# Patient Record
Sex: Male | Born: 1965 | Race: Black or African American | Hispanic: No | Marital: Single | Smoking: Never smoker
Health system: Southern US, Community
[De-identification: ages and names within clinical notes are randomized; demographics above are authoritative.]

## PROBLEM LIST (undated history)

## (undated) DIAGNOSIS — R202 Paresthesia of skin: Secondary | ICD-10-CM

## (undated) DIAGNOSIS — R200 Anesthesia of skin: Secondary | ICD-10-CM

## (undated) HISTORY — DX: Paresthesia of skin: R20.0

## (undated) HISTORY — PX: KNEE ARTHROSCOPY: SHX127

## (undated) HISTORY — DX: Paresthesia of skin: R20.2

---

## 2013-02-06 ENCOUNTER — Emergency Department (HOSPITAL_COMMUNITY)
Admission: EM | Admit: 2013-02-06 | Discharge: 2013-02-06 | Disposition: A | Discharge status: Home / Self Care | Payer: BC Managed Care – PPO | Attending: Emergency Medicine | Admitting: Emergency Medicine

## 2013-02-06 ENCOUNTER — Emergency Department (HOSPITAL_COMMUNITY): Payer: BC Managed Care – PPO

## 2013-02-06 ENCOUNTER — Encounter (HOSPITAL_COMMUNITY): Payer: Self-pay | Admitting: Emergency Medicine

## 2013-02-06 DIAGNOSIS — R3915 Urgency of urination: Secondary | ICD-10-CM | POA: Insufficient documentation

## 2013-02-06 DIAGNOSIS — R109 Unspecified abdominal pain: Secondary | ICD-10-CM | POA: Insufficient documentation

## 2013-02-06 DIAGNOSIS — R300 Dysuria: Secondary | ICD-10-CM | POA: Insufficient documentation

## 2013-02-06 DIAGNOSIS — M1990 Unspecified osteoarthritis, unspecified site: Secondary | ICD-10-CM | POA: Insufficient documentation

## 2013-02-06 LAB — URINALYSIS, ROUTINE W REFLEX MICROSCOPIC
Bilirubin Urine: NEGATIVE
Ketones, ur: NEGATIVE mg/dL
Leukocytes, UA: NEGATIVE
Nitrite: NEGATIVE
Protein, ur: NEGATIVE mg/dL
Urobilinogen, UA: 1 mg/dL (ref 0.0–1.0)

## 2013-02-06 NOTE — ED Notes (Signed)
Pt states he started having R flank pain last night.  Pt states sharp pain comes and goes.  Pt states has not been able to urinate since last night, denies lower abd pain.  No hx of abd/urinary issues.

## 2013-02-06 NOTE — ED Provider Notes (Signed)
History     CSN: [id number]  Arrival date & time 02/06/13  0950   First MD Initiated Contact with Patient 02/06/13 1006      Chief Complaint  Patient presents with  . Flank Pain    (Consider location/radiation/quality/duration/timing/severity/associated sxs/prior treatment) HPI Comments: 47 year old male with no significant past medical history presents to the emergency department with his wife complaining of sudden onset right-sided flank pain beginning 8 hours ago. Patient states the pain woke him up from sleep and was sharp in origin, rated 10 out of 10. Pain lasted one hour, he got up and walked around, went to lay back down and pain went away. A few hours later he woke up again with the same sharp pain, lasting about an hour. Patient then went to work, pain returned and decided to come to the emergency department. Currently not in any pain at this time. Each time the pain came on he tried to use the bathroom and was unable to urinate despite having the urge. Yesterday he was urinating without any difficulty. Denies increased urinary frequency, dysuria or hematuria. No history of kidney stones. Denies fever, chills, nausea or vomiting. No history of abdominal surgeries.  Patient is a 47 y.o. male presenting with flank pain. The history is provided by the patient and the spouse.  Flank Pain Pertinent negatives include no abdominal pain, chills, diaphoresis, fever, nausea or vomiting.    History reviewed. No pertinent past medical history.  History reviewed. No pertinent past surgical history.  History reviewed. No pertinent family history.  History  Substance Use Topics  . Smoking status: Never Smoker   . Smokeless tobacco: Not on file  . Alcohol Use: No      Review of Systems  Constitutional: Negative for fever, chills and diaphoresis.  Gastrointestinal: Negative for nausea, vomiting, abdominal pain, diarrhea and constipation.  Genitourinary: Positive for urgency, flank  pain and difficulty urinating. Negative for dysuria and frequency.  Musculoskeletal: Negative for back pain.  All other systems reviewed and are negative.    Allergies  Review of patient's allergies indicates not on file.  Home Medications  No current outpatient prescriptions on file.  BP 133/90  Pulse 74  Temp(Src) 97.5 F (36.4 C) (Oral)  Resp 18  SpO2 97%  Physical Exam  Nursing note and vitals reviewed. Constitutional: He is oriented to person, place, and time. He appears well-developed and well-nourished. No distress.  HENT:  Head: Normocephalic and atraumatic.  Eyes: Conjunctivae are normal.  Neck: Normal range of motion.  Cardiovascular: Normal rate, regular rhythm and normal heart sounds.   Pulmonary/Chest: Effort normal and breath sounds normal. No respiratory distress.  Abdominal: Soft. Normal appearance and bowel sounds are normal. He exhibits no distension. There is tenderness. There is no rigidity, no rebound, no guarding and no CVA tenderness.    Musculoskeletal: Normal range of motion. He exhibits no edema.  Neurological: He is alert and oriented to person, place, and time.  Skin: Skin is warm and dry. He is not diaphoretic.  Psychiatric: He has a normal mood and affect. His behavior is normal.    ED Course  Procedures (including critical care time)  Labs Reviewed  URINALYSIS, ROUTINE W REFLEX MICROSCOPIC - Abnormal; Notable for the following:    Specific Gravity, Urine 1.031 (*)    All other components within normal limits   Ct Abdomen Pelvis Wo Contrast  02/06/2013  *RADIOLOGY REPORT*  Clinical Data: Right flank pain  CT ABDOMEN AND PELVIS WITHOUT CONTRAST  Technique:  Multidetector CT imaging of the abdomen and pelvis was performed following the standard protocol without intravenous contrast.  Comparison: None.  Findings: Minimal left anterior descending coronary artery calcification.  Tiny visceral pleural node in the right major fissure on image  three.  No hydronephrosis.  No urinary calculus.  Unenhanced liver, gallbladder, spleen, pancreas, adrenal glands are within normal limits  Normal appendix.  Prostate and bladder are unremarkable  No free fluid.  No obvious abnormal adenopathy.  Advanced degenerative disc disease at L4-5 and L5-S1 there is significant bilateral lateral recess narrowing and facet arthropathy at L4-5.  IMPRESSION: No urinary obstruction or urinary calculus.  Advanced degenerative change of the lower lumbar spine which could contribute to flank pain.   Original Report Authenticated By: Jolaine Click, M.D.      1. Flank pain   2. Osteoarthritis       MDM  Probable kidney stone- obtaining CT abdomen/pelvis without contrast, U/A. 11:12 AM CT scan showing no stone, however advanced DJD of lower lumbar spine. Still without pain in the ED. Was able to urinate without difficulty. Discharged home with close return precautions. His risk I given for PCP followup. Patient states understanding of plan and is agreeable.    Trevor Mace, PA-C 02/06/13 1113

## 2013-02-07 NOTE — ED Provider Notes (Signed)
Medical screening examination/treatment/procedure(s) were performed by non-physician practitioner and as supervising physician I was immediately available for consultation/collaboration.   Dejean Tribby M Ruvi Fullenwider, DO 02/07/13 1112 

## 2013-11-16 IMAGING — CT CT ABD-PELV W/O CM
1 series · 12 of 32 positions shown, 15 images · non-contrast
Comparison: None.

CLINICAL DATA: Right flank pain

CT ABDOMEN AND PELVIS WITHOUT CONTRAST
TECHNIQUE: Multidetector CT imaging of the abdomen and pelvis was
performed following the standard protocol without intravenous
contrast.

[Series 6: sagittal · sagittal · 0.74mm/px · 12 of 128 slices shown, 15 images]
[im 5/128  lung]
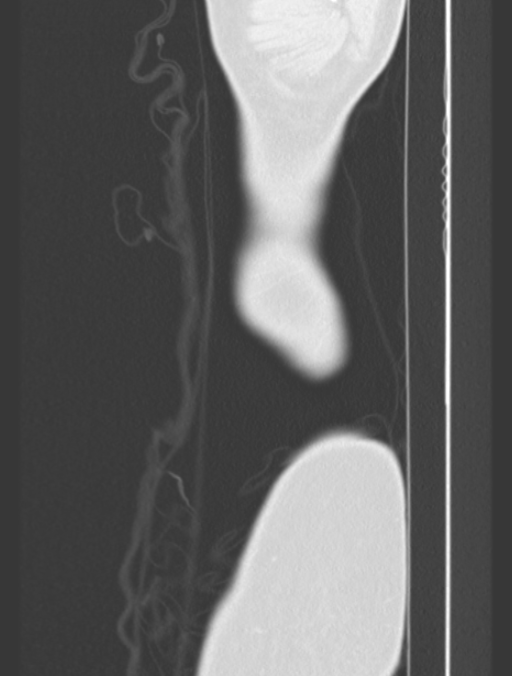
[im 9/128  soft-tissue]
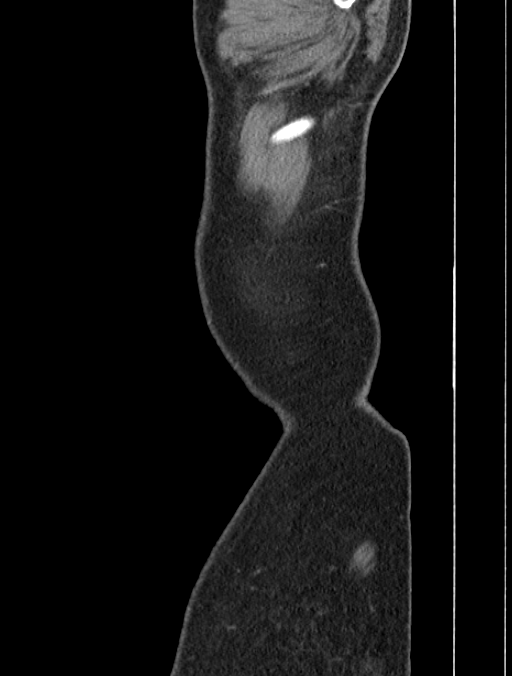
[im 9/128  lung]
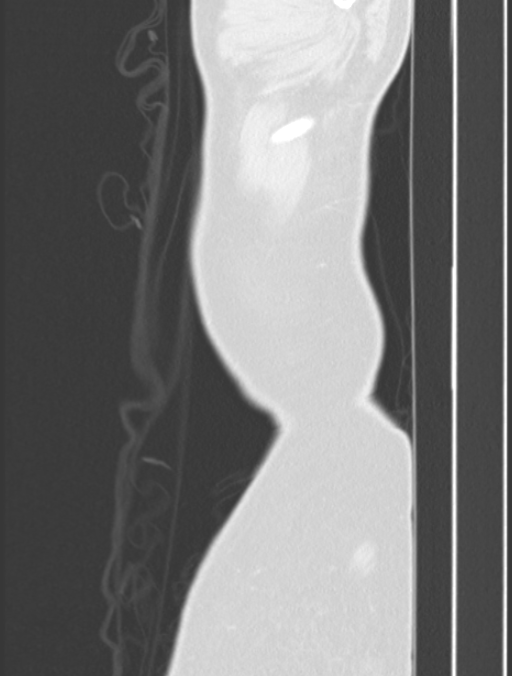
[im 9/128  bone]
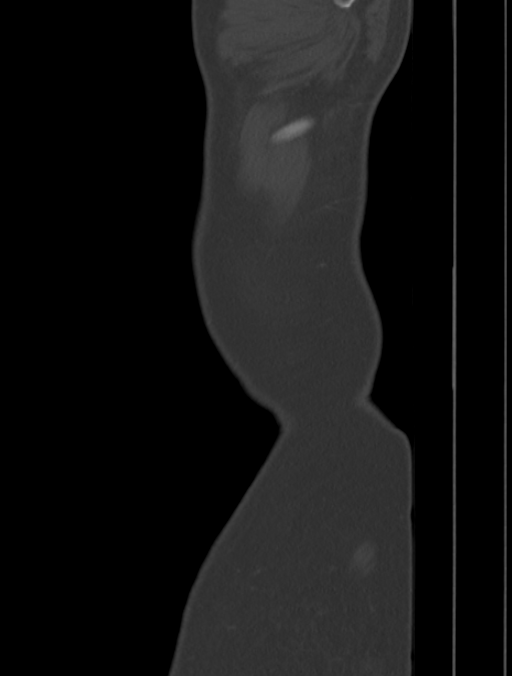
[im 13/128  lung]
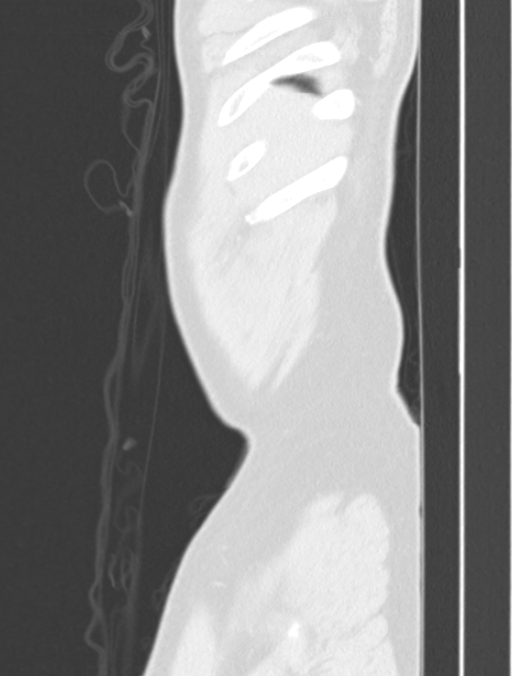
[im 17/128  lung]
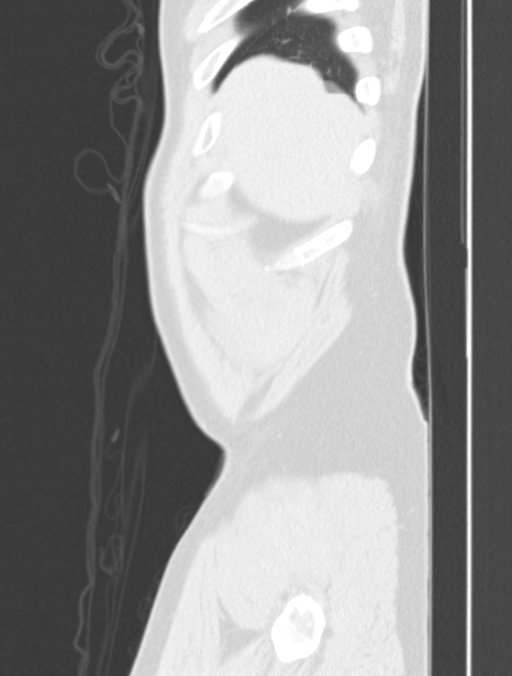
[im 25/128  soft-tissue]
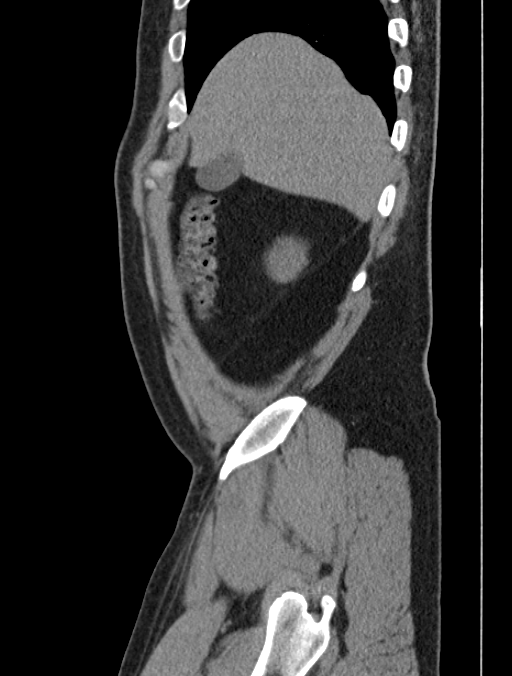
[im 37/128  soft-tissue]
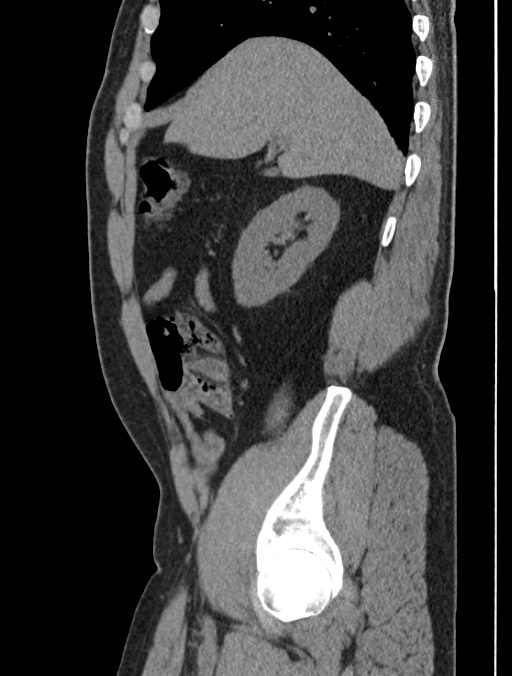
[im 50/128  soft-tissue]
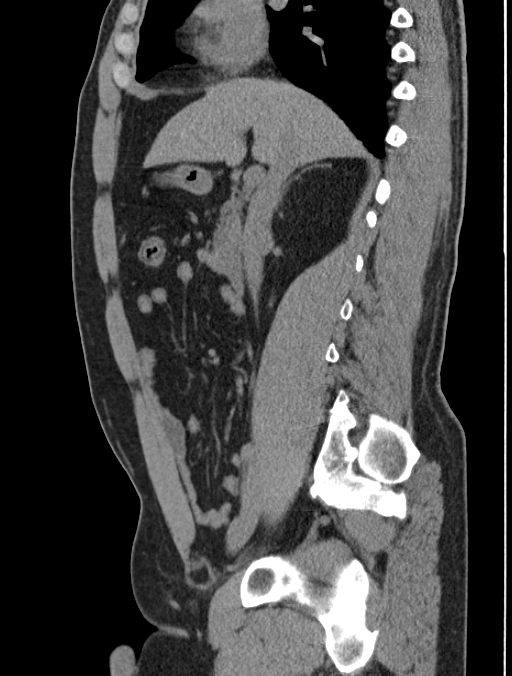
[im 66/128  soft-tissue]
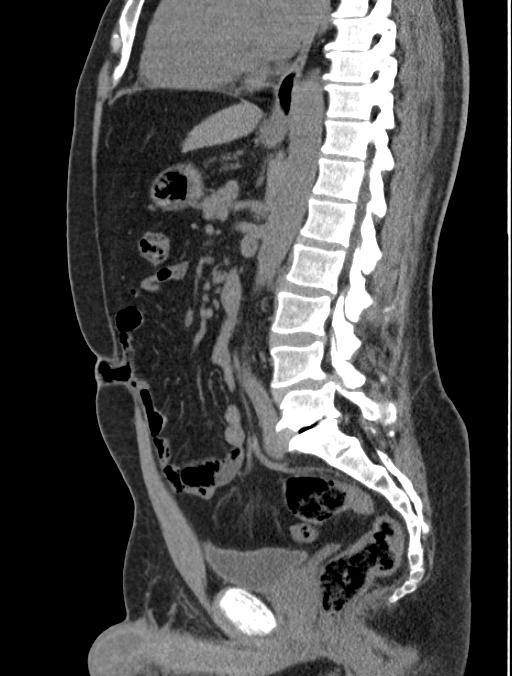
[im 78/128  soft-tissue]
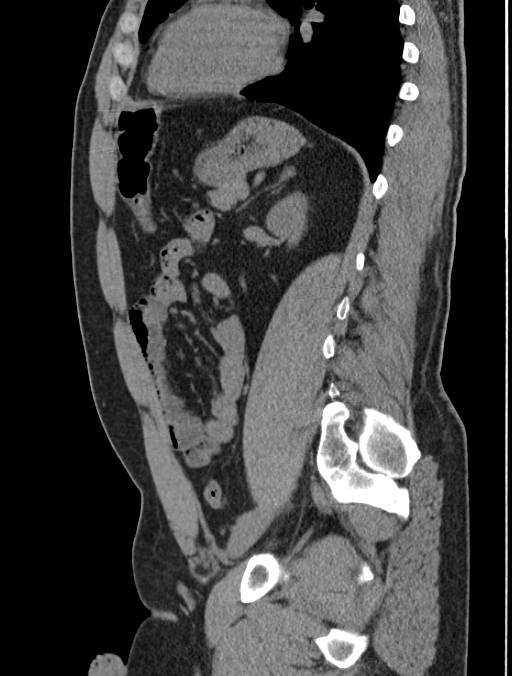
[im 91/128  soft-tissue]
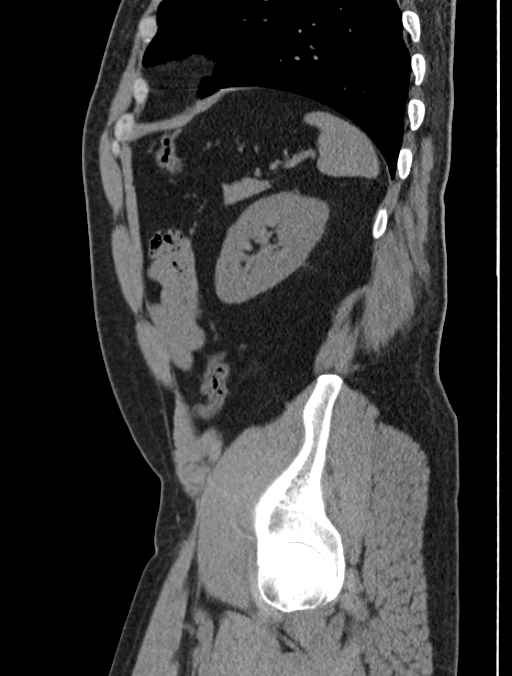
[im 107/128  soft-tissue]
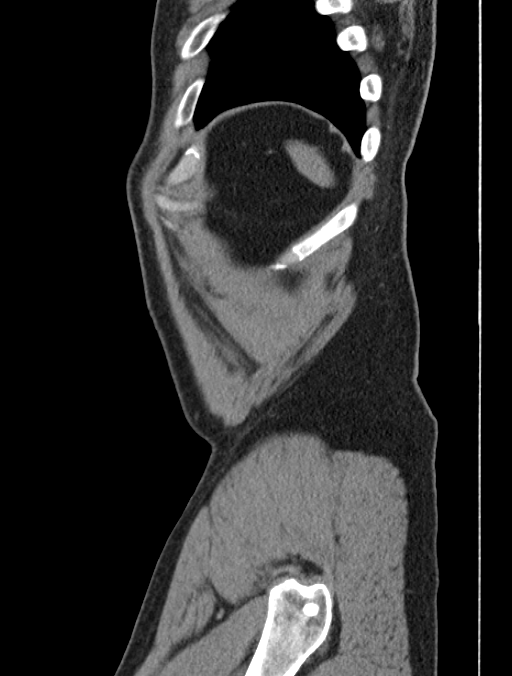
[im 119/128  soft-tissue]
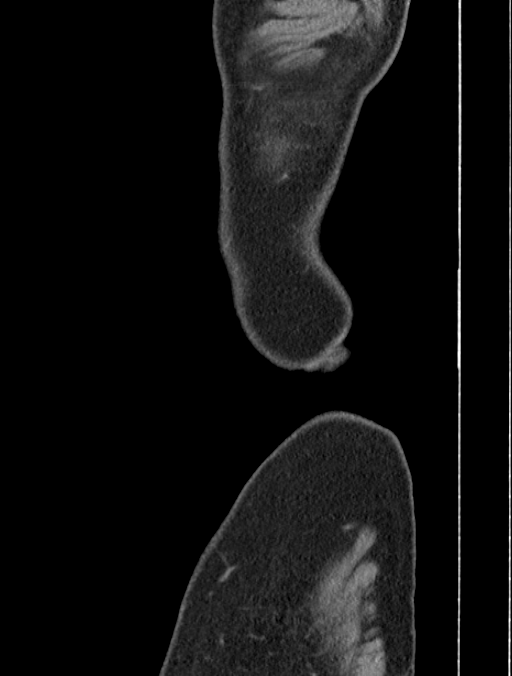
[im 119/128  bone]
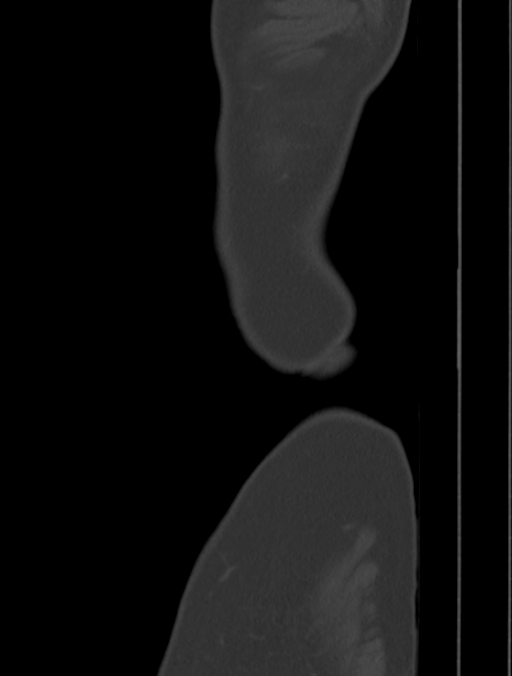

[12 of 32 positions shown; findings below may reference images not displayed]

FINDINGS: Minimal left anterior descending coronary artery
calcification.

Tiny visceral pleural node in the right major fissure on image
three.

No hydronephrosis.  No urinary calculus.

Unenhanced liver, gallbladder, spleen, pancreas, adrenal glands are
within normal limits

Normal appendix.

Prostate and bladder are unremarkable

No free fluid.  No obvious abnormal adenopathy.

Advanced degenerative disc disease at L4-5 and L5-S1 there is
significant bilateral lateral recess narrowing and facet
arthropathy at L4-5.
IMPRESSION: No urinary obstruction or urinary calculus.

Advanced degenerative change of the lower lumbar spine which could
contribute to flank pain.

## 2015-12-21 ENCOUNTER — Encounter: Payer: Self-pay | Admitting: *Deleted

## 2015-12-22 ENCOUNTER — Encounter: Payer: Self-pay | Admitting: Neurology

## 2015-12-22 ENCOUNTER — Ambulatory Visit (INDEPENDENT_AMBULATORY_CARE_PROVIDER_SITE_OTHER): Payer: BLUE CROSS/BLUE SHIELD | Admitting: Neurology

## 2015-12-22 VITALS — BP 120/84 | HR 76 | Ht 72.0 in | Wt 236.6 lb

## 2015-12-22 DIAGNOSIS — M5412 Radiculopathy, cervical region: Secondary | ICD-10-CM

## 2015-12-22 NOTE — Patient Instructions (Addendum)
NCS/EMG of the left arm We will call you with the results and let you know the next step Return to clinic in 3 months as needed

## 2015-12-22 NOTE — Progress Notes (Signed)
Note routed

## 2015-12-22 NOTE — Progress Notes (Signed)
[facility] Clinic Note - Initial Visit   Date: 12/22/2015  Christopher Fuller MRN: [id number] DOB: 1966-09-23   Dear Dr. Kateri Plummer:  Thank you for your kind referral of Christopher Fuller for consultation of left arm numbness. Although his history is well known to you, please allow [place] to reiterate it for the purpose of our medical record. The patient was accompanied to the clinic by self.    History of Present Illness: Christopher Fuller is a 50 y.o. right-handed African American male with no prior medical history presenting for evaluation of left arm numbness.    Starting around October 2016, he began having spells of intermittent numbness and tingling in the left shoulder region and radiates down his posterior arm and dorsum of the hand.  Symptoms last about 10-15 seconds and self-resolve, but can occur about once per hour.  He denies any neck pain or weakness.  He has not noticed any triggers or anything that alleviates the paresthesias.  Discomfort does not wake him up from sleeping.  No similar symptoms on the right side. He does not have electrical, throbbing or achy pain.  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  Denies any neck trauma or injury.   Out-side paper records, electronic medical record, and images have been reviewed where available and summarized as:  Labs 11/19/2015:  CBC and CMP within normal limits  Past Medical History  Diagnosis Date  . Numbness and tingling in left arm     Past Surgical History  Procedure Laterality Date  . Knee arthroscopy Right      Medications:  No outpatient encounter prescriptions on file as of 12/22/2015.   No facility-administered encounter medications on file as of 12/22/2015.     Allergies: No Known Allergies  Family History: Family History  Problem Relation Age of Onset  . Diabetes Father   . Hypertension Father   . Congestive Heart Failure Father   . Breast  cancer Mother     Deceased  . Lung cancer Sister   . Diabetes Brother   . Valvular heart disease Father   . Alcohol abuse Father   . Juvenile Diabetes Son   . Healthy Daughter     Social History: Social History  Substance Use Topics  . Smoking status: Never Smoker   . Smokeless tobacco: Never Used  . Alcohol Use: No   Social History   Social History Narrative   Lives with fiancee in a 2 story home.  Has 3 children.     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     Education: college       Review of Systems:  CONSTITUTIONAL: No fevers, chills, night sweats, or weight loss.   EYES: No visual changes or eye pain ENT: No hearing changes.  No history of nose bleeds.   RESPIRATORY: No cough, wheezing and shortness of breath.   CARDIOVASCULAR: Negative for chest pain, and palpitations.   GI: Negative for abdominal discomfort, blood in stools or black stools.  No recent change in bowel habits.   GU:  No history of incontinence.   MUSCLOSKELETAL: No history of joint pain or swelling.  No myalgias.   SKIN: Negative for lesions, rash, and itching.   HEMATOLOGY/ONCOLOGY: Negative for prolonged bleeding, bruising easily, and swollen nodes.  No history of cancer.   ENDOCRINE: Negative for cold or heat intolerance, polydipsia or goiter.   PSYCH:  No depression or anxiety  symptoms.   NEURO: As Above.   Vital Signs:  BP 120/84 mmHg  Pulse 76  Ht 6' (1.829 m)  Wt 236 lb 9 oz (107.304 kg)  BMI 32.08 kg/m2  SpO2 94% Pain Scale: 0 on a scale of 0-10   General Medical Exam:   General:  Well appearing, comfortable.   Eyes/ENT: see cranial nerve examination.   Neck: No masses appreciated.  Full range of motion without tenderness.  No carotid bruits. Respiratory:  Clear to auscultation, good air entry bilaterally.   Cardiac:  Regular rate and rhythm, no murmur.   Extremities:  No deformities, edema, or skin discoloration.  Skin:  No rashes or lesions.  Neurological  Exam: MENTAL STATUS including orientation to time, place, person, recent and remote memory, attention span and concentration, language, and fund of knowledge is normal.  Speech is not dysarthric.  CRANIAL NERVES: II:  No visual field defects.  Unremarkable fundi.   III-IV-VI: Pupils equal round and reactive to light.  Normal conjugate, extra-ocular eye movements in all directions of gaze.  No nystagmus.  No ptosis.   V:  Normal facial sensation.     VII:  Normal facial symmetry and movements.  VIII:  Normal hearing and vestibular function.   IX-X:  Normal palatal movement.   XI:  Normal shoulder shrug and head rotation.   XII:  Normal tongue strength and range of motion, no deviation or fasciculation.  MOTOR:  No atrophy, fasciculations or abnormal movements.  No pronator drift.  Tone is normal.    Right Upper Extremity:    Left Upper Extremity:    Deltoid  5/5   Deltoid  5/5   Biceps  5/5   Biceps  5/5   Triceps  5/5   Triceps  5/5   Wrist extensors  5/5   Wrist extensors  5/5   Wrist flexors  5/5   Wrist flexors  5/5   Finger extensors  5/5   Finger extensors  5/5   Finger flexors  5/5   Finger flexors  5/5   Dorsal interossei  5/5   Dorsal interossei  5/5   Abductor pollicis  5/5   Abductor pollicis  5/5   Tone (Ashworth scale)  0  Tone (Ashworth scale)  0   Right Lower Extremity:    Left Lower Extremity:    Hip flexors  5/5   Hip flexors  5/5   Hip extensors  5/5   Hip extensors  5/5   Knee flexors  5/5   Knee flexors  5/5   Knee extensors  5/5   Knee extensors  5/5   Dorsiflexors  5/5   Dorsiflexors  5/5   Plantarflexors  5/5   Plantarflexors  5/5   Toe extensors  5/5   Toe extensors  5/5   Toe flexors  5/5   Toe flexors  5/5   Tone (Ashworth scale)  0  Tone (Ashworth scale)  0   MSRs:  Right                                                                 Left brachioradialis 2+  brachioradialis 2+  biceps 2+  biceps 2+  triceps 2+  triceps 2+  patellar 2+  patellar  2+   ankle jerk 2+  ankle jerk 2+  Hoffman no  Hoffman no  plantar response down  plantar response down   SENSORY:  Normal and symmetric perception of light touch, pinprick, vibration, and proprioception.  Romberg's sign absent.   COORDINATION/GAIT: Normal finger-to- nose-finger and heel-to-shin.  Intact rapid alternating movements bilaterally.  Able to rise from a chair without using arms.  Gait narrow based and stable. Tandem and stressed gait intact.    IMPRESSION: Mr. Higinbotham is a delightful and very healthy 50 year-old gentleman referred for evaluation of left arm paresthesias.  His exam is entirely normal and non-focal.  Based on his history, he most likely has a C7 radiculopathy on the left.  NCS/EMG of the left arm will be performed to localize the symptoms.  I offered gabapentin to relieve his paresthesias, but he does not report it to be bothersome enough to take medications. Pending the results, neck PT will be the next step.  Return to clinic in 3 months.   The duration of this appointment visit was 35 minutes of face-to-face time with the patient.  Greater than 50% of this time was spent in counseling, explanation of diagnosis, planning of further management, and coordination of care.   Thank you for allowing me to participate in patient's care.  If I can answer any additional questions, I would be pleased to do so.    Sincerely,    Sharyon Peitz K. Allena Katz, DO

## 2015-12-27 ENCOUNTER — Telehealth: Payer: Self-pay | Admitting: Neurology

## 2015-12-27 ENCOUNTER — Ambulatory Visit (INDEPENDENT_AMBULATORY_CARE_PROVIDER_SITE_OTHER): Payer: BLUE CROSS/BLUE SHIELD | Admitting: Neurology

## 2015-12-27 DIAGNOSIS — M5412 Radiculopathy, cervical region: Secondary | ICD-10-CM

## 2015-12-27 DIAGNOSIS — G5622 Lesion of ulnar nerve, left upper limb: Secondary | ICD-10-CM

## 2015-12-27 NOTE — Telephone Encounter (Signed)
Results of EMG discussed with patient which shows ulnar neuropathy and cervical radiculopathy. Strategies to minimize ulnar nerve injury were discussed. He will start physical therapy for cervical radiculopathy.  Christopher Fuller K. Allena KatzPatel, DO

## 2015-12-27 NOTE — Procedures (Signed)
[facility]  [address], [address]  [place], [place] [address] Tel: [phone] Fax:  [phone] Test Date:  12/27/2015  Patient: Christopher AltesRonnie Gearhart DOB: Dec 26, 1965 Physician: Nita Sickleonika Ithzel Fedorchak, DO  Sex: Male Height: 6' " Ref Phys: Nita Sickleonika Faraz Ponciano, DO  ID#: [id number] Temp: 34.1C Technician: Judie PetitM. Dean   Patient Complaints: This is a 50 year-old gentleman referred for evaluation of left radicular arm pain.  NCV & EMG Findings: Extensive electrodiagnostic testing of the left upper extremity shows: 1. Left median, ulnar, and radial sensory responses are within normal limits. 2. Left median motor response is normal.  Left ulnar motor nerve showed decreased conduction velocity across the elbow (A Elbow-B Elbow, 43 m/s).   3. Rapid recruitment pattern is seen along the C6-C7 myotomes on the left, without accompanied active denervation. Motor unit configuration is within normal limits.  Impression: 1. Chronic C6-C7 radiculopathy affecting the left upper extremity, mild in degree electrically 2. Left ulnar neuropathy with slowing across the elbow, purely demyelinating in type.   ___________________________ Nita Sickleonika Anntonette Madewell, DO    Nerve Conduction Studies Anti Sensory Summary Table   Site NR Peak (ms) Norm Peak (ms) P-T Amp (V) Norm P-T Amp  Left Median Anti Sensory (2nd Digit)  34.1C  Wrist    3.1 <3.6 15.4 >15  Left Radial Anti Sensory (Base 1st Digit)  34.1C  Wrist    1.9 <2.7 14.3 >14  Left Ulnar Anti Sensory (5th Digit)  34.1C  Wrist    3.1 <3.1 14.0 >10   Motor Summary Table   Site NR Onset (ms) Norm Onset (ms) O-P Amp (mV) Norm O-P Amp Site1 Site2 Delta-0 (ms) Dist (cm) Vel (m/s) Norm Vel (m/s)  Left Median Motor (Abd Poll Brev)  34.1C  Wrist    3.6 <4.0 10.0 >6 Elbow Wrist 4.1 24.0 59 >50  Elbow    7.7  10.0         Left Ulnar Motor (Abd Dig Minimi)  34.1C  Wrist    2.6 <3.1 10.8 >7 B Elbow Wrist 3.4 20.0 59 >50  B Elbow    6.0  10.2  A Elbow B Elbow 2.3 10.0  43 >50  A Elbow    8.3  9.5          EMG   Side Muscle Ins Act Fibs Psw Fasc Number Recrt Dur Dur. Amp Amp. Poly Poly. Comment  Left 1stDorInt Nml Nml Nml Nml Nml Nml Nml Nml Nml Nml Nml Nml N/A  Left Ext Indicis Nml Nml Nml Nml Nml Nml Nml Nml Nml Nml Nml Nml N/A  Left PronatorTeres Nml Nml Nml Nml 1- Rapid Nml Nml Nml Nml Nml Nml N/A  Left Biceps Nml Nml Nml Nml Nml Nml Nml Nml Nml Nml Nml Nml N/A  Left Triceps Nml Nml Nml Nml Nml Nml Nml Nml Nml Nml Nml Nml N/A  Left Deltoid Nml Nml Nml Nml Nml Nml Nml Nml Nml Nml Nml Nml N/A  Left BrachioRad Nml Nml Nml Nml 1- Rapid Nml Nml Nml Nml Nml Nml N/A  Left FlexDigProf 4,5 Nml Nml Nml Nml Nml Nml Nml Nml Nml Nml Nml Nml N/A      Waveforms:

## 2015-12-28 NOTE — Telephone Encounter (Signed)
Referral sent for PT

## 2016-03-23 ENCOUNTER — Ambulatory Visit: Payer: BLUE CROSS/BLUE SHIELD | Admitting: Neurology

## 2016-03-23 DIAGNOSIS — Z029 Encounter for administrative examinations, unspecified: Secondary | ICD-10-CM

## 2019-09-22 ENCOUNTER — Other Ambulatory Visit: Payer: Self-pay

## 2019-09-22 DIAGNOSIS — Z20822 Contact with and (suspected) exposure to covid-19: Secondary | ICD-10-CM

## 2019-09-24 LAB — NOVEL CORONAVIRUS, NAA: SARS-CoV-2, NAA: NOT DETECTED

## 2022-04-16 ENCOUNTER — Ambulatory Visit
Admission: RE | Admit: 2022-04-16 | Discharge: 2022-04-16 | Disposition: A | Discharge status: Home / Self Care | Payer: Managed Care, Other (non HMO) | Source: Ambulatory Visit | Attending: Family Medicine | Admitting: Family Medicine

## 2022-04-16 ENCOUNTER — Other Ambulatory Visit: Payer: Self-pay | Admitting: Family Medicine

## 2022-04-16 DIAGNOSIS — M2560 Stiffness of unspecified joint, not elsewhere classified: Secondary | ICD-10-CM

## 2022-04-16 DIAGNOSIS — R2230 Localized swelling, mass and lump, unspecified upper limb: Secondary | ICD-10-CM
